# Patient Record
Sex: Female | Born: 1995
Health system: Southern US, Community
[De-identification: ages and names within clinical notes are randomized; demographics above are authoritative.]

## PROBLEM LIST (undated history)

## (undated) DIAGNOSIS — R011 Cardiac murmur, unspecified: Secondary | ICD-10-CM

## (undated) DIAGNOSIS — D649 Anemia, unspecified: Secondary | ICD-10-CM

## (undated) HISTORY — PX: OTHER SURGICAL HISTORY: SHX169

## (undated) HISTORY — PX: ADENOIDECTOMY: SUR15

## (undated) HISTORY — DX: Anemia, unspecified: D64.9

---
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
## 2016-01-15 DIAGNOSIS — Z00129 Encounter for routine child health examination without abnormal findings: Secondary | ICD-10-CM | POA: Diagnosis not present

## 2016-03-26 ENCOUNTER — Encounter: Payer: Self-pay | Admitting: Family Medicine

## 2016-07-16 ENCOUNTER — Encounter: Payer: Self-pay | Admitting: Family Medicine

## 2016-08-13 ENCOUNTER — Encounter: Payer: Self-pay | Admitting: Family Medicine

## 2016-10-14 ENCOUNTER — Encounter: Payer: Self-pay | Admitting: Family Medicine

## 2016-11-01 DIAGNOSIS — M542 Cervicalgia: Secondary | ICD-10-CM | POA: Diagnosis not present

## 2016-11-25 ENCOUNTER — Encounter: Payer: Self-pay | Admitting: Family Medicine

## 2016-12-17 ENCOUNTER — Encounter: Payer: Self-pay | Admitting: Family Medicine

## 2016-12-17 ENCOUNTER — Ambulatory Visit (INDEPENDENT_AMBULATORY_CARE_PROVIDER_SITE_OTHER): Payer: BLUE CROSS/BLUE SHIELD | Admitting: Family Medicine

## 2016-12-17 VITALS — BP 132/68 | HR 102 | Temp 98.3°F | Resp 14 | Ht 65.0 in | Wt 127.0 lb

## 2016-12-17 DIAGNOSIS — K30 Functional dyspepsia: Secondary | ICD-10-CM

## 2016-12-17 DIAGNOSIS — Z23 Encounter for immunization: Secondary | ICD-10-CM

## 2016-12-17 DIAGNOSIS — Z0000 Encounter for general adult medical examination without abnormal findings: Secondary | ICD-10-CM

## 2016-12-17 DIAGNOSIS — D508 Other iron deficiency anemias: Secondary | ICD-10-CM | POA: Diagnosis not present

## 2016-12-17 DIAGNOSIS — F411 Generalized anxiety disorder: Secondary | ICD-10-CM

## 2016-12-17 NOTE — Addendum Note (Signed)
Addended by: Phillips OdorSIX, CHRISTINA H on: 12/17/2016 05:29 PM   Modules accepted: Orders

## 2016-12-17 NOTE — Progress Notes (Signed)
Subjective:    Patient ID: Gwendolyn RileyAutumn S Vandagriff, female    DOB: 04/29/1996, 21 y.o.   MRN: [id number]  Patient presents for CPE (has college form) Pt here for physical exam. She is currently college student at [employer] and in [employer]  Sexual activity Alcohol/illicit/tobacco- denies Required Immunizations UTD for school, has not had HPV vaccine mother declines this Menses- Regular she is currently on her menstrual cycle. She has history of anxiety and mood disorder. This is been going on since childhood. We'll address his multiple times. She saw a therapist in the past. She states her anxiety was fairly well controlled while she was at school but the mother states that she went to about the depression and did not leave her room except for class. More recently her uncle passed away suddenly and since then she's had multiple somatic complaints and she is worried that something happened to her. Her sleep has been interrupted as well. They do not want to try any medications.  She also complains of reflux symptoms stools acid going up into her stomach and chest at times typically after she used heavy meals or spicy food and she often eats late because of her job. She denies any nausea vomiting no diarrhea. She is not taking anything for these symptoms.  Review Of Systems:  GEN- denies fatigue, fever, weight loss,weakness, recent illness HEENT- denies eye drainage, change in vision, nasal discharge, CVS- denies chest pain, palpitations RESP- denies SOB, cough, wheeze ABD- denies N/V, change in stools, abd pain GU- denies dysuria, hematuria, dribbling, incontinence MSK- denies joint pain, muscle aches, injury Neuro- denies headache, dizziness, syncope, seizure activity       Objective:    BP 132/68   Pulse (!) 102   Temp 98.3 F (36.8 C) (Oral)   Resp 14   Ht 5\' 5"  (1.651 m)   Wt 127 lb (57.6 kg)   LMP 12/15/2016   SpO2 99%   BMI 21.13 kg/m  GEN- NAD, alert and oriented  x3 HEENT- PERRL, EOMI, non injected sclera, pink conjunctiva, MMM, oropharynx clear Neck- Supple, no LAD, no thyromegaly  CVS- RRR, soft systolic murmur RESP-CTAB ABD-NABS,soft,NT,ND Psych- anxious, tearful at times, no SI, no HI EXT- No edema Pulses- Radial 2+        Assessment & Plan:      Problem List Items Addressed This Visit    Anemia   Relevant Orders   CBC with Differential/Platelet   Iron   GAD (generalized anxiety disorder)    We'll try melatonin for sleep. Think she would benefit from  going psychotherapy close to campus which is where she is residing even most of the summer.In case she goes through some difficulties at school,  she can have someone to talk to. I think that she would benefit from medication down the road but they decline at this time.      Relevant Orders   Ambulatory referral to Psychology    Other Visit Diagnoses    Routine general medical examination at a health care facility    -  Primary   CPE done, [facility] today, has history of anemia will recheck levels. Declines HPV, form completed for marching band   Relevant Orders   CBC with Differential/Platelet   Comprehensive metabolic panel   TSH   Acid indigestion       Trial of OTC zantac, discussed late meals and the fast food      Note: This dictation was prepared  with Dragon dictation along with smaller phrase technology. Any transcriptional errors that result from this process are unintentional.

## 2016-12-17 NOTE — Assessment & Plan Note (Addendum)
We'll try melatonin for sleep. Think she would benefit from  going psychotherapy close to campus which is where she is residing even most of the summer.In case she goes through some difficulties at school,  she can have someone to talk to. I think that she would benefit from medication down the road but they decline at this time.

## 2016-12-17 NOTE — Patient Instructions (Addendum)
F/u 1 YEAR For Physical  Try Zantac 75mg  at bedtime this is over the counter Try melatonin for sleep, over the counter  Referral to therapy

## 2016-12-18 LAB — COMPREHENSIVE METABOLIC PANEL
ALBUMIN: 4.6 g/dL (ref 3.6–5.1)
ALT: 5 U/L (ref 5–32)
AST: 13 U/L (ref 12–32)
Alkaline Phosphatase: 44 U/L — ABNORMAL LOW (ref 47–176)
BUN: 10 mg/dL (ref 7–20)
CHLORIDE: 106 mmol/L (ref 98–110)
CO2: 23 mmol/L (ref 20–31)
Calcium: 9.3 mg/dL (ref 8.9–10.4)
Creat: 0.89 mg/dL (ref 0.50–1.00)
Glucose, Bld: 95 mg/dL (ref 70–99)
POTASSIUM: 4.2 mmol/L (ref 3.8–5.1)
Sodium: 138 mmol/L (ref 135–146)
TOTAL PROTEIN: 7.4 g/dL (ref 6.3–8.2)
Total Bilirubin: 0.6 mg/dL (ref 0.2–1.1)

## 2016-12-18 LAB — CBC WITH DIFFERENTIAL/PLATELET
BASOS ABS: 0 {cells}/uL (ref 0–200)
Basophils Relative: 0 %
EOS ABS: 32 {cells}/uL (ref 15–500)
EOS PCT: 1 %
HCT: 32.2 % — ABNORMAL LOW (ref 35.0–45.0)
Hemoglobin: 10.9 g/dL — ABNORMAL LOW (ref 12.0–15.0)
Lymphocytes Relative: 34 %
Lymphs Abs: 1088 cells/uL (ref 850–3900)
MCH: 31.1 pg (ref 27.0–33.0)
MCHC: 33.9 g/dL (ref 32.0–36.0)
MCV: 92 fL (ref 80.0–100.0)
MONOS PCT: 11 %
MPV: 9.3 fL (ref 7.5–12.5)
Monocytes Absolute: 352 cells/uL (ref 200–950)
NEUTROS ABS: 1728 {cells}/uL (ref 1500–7800)
NEUTROS PCT: 54 %
PLATELETS: 271 10*3/uL (ref 140–400)
RBC: 3.5 MIL/uL — ABNORMAL LOW (ref 3.80–5.10)
RDW: 13.8 % (ref 11.0–15.0)
WBC: 3.2 10*3/uL — ABNORMAL LOW (ref 3.8–10.8)

## 2016-12-18 LAB — TSH: TSH: 0.84 m[IU]/L (ref 0.50–4.30)

## 2016-12-18 LAB — IRON: IRON: 132 ug/dL (ref 27–164)

## 2017-06-08 ENCOUNTER — Ambulatory Visit: Payer: Self-pay | Admitting: Family

## 2017-12-27 ENCOUNTER — Encounter: Payer: Self-pay | Admitting: Family Medicine

## 2017-12-27 ENCOUNTER — Ambulatory Visit (INDEPENDENT_AMBULATORY_CARE_PROVIDER_SITE_OTHER): Payer: BLUE CROSS/BLUE SHIELD | Admitting: Family Medicine

## 2017-12-27 ENCOUNTER — Other Ambulatory Visit: Payer: Self-pay

## 2017-12-27 VITALS — BP 130/82 | HR 65 | Temp 98.5°F | Resp 16 | Ht 65.0 in | Wt 122.1 lb

## 2017-12-27 DIAGNOSIS — Z23 Encounter for immunization: Secondary | ICD-10-CM

## 2017-12-27 DIAGNOSIS — Z111 Encounter for screening for respiratory tuberculosis: Secondary | ICD-10-CM | POA: Diagnosis not present

## 2017-12-27 DIAGNOSIS — F410 Panic disorder [episodic paroxysmal anxiety] without agoraphobia: Secondary | ICD-10-CM | POA: Diagnosis not present

## 2017-12-27 DIAGNOSIS — D508 Other iron deficiency anemias: Secondary | ICD-10-CM

## 2017-12-27 DIAGNOSIS — Z114 Encounter for screening for human immunodeficiency virus [HIV]: Secondary | ICD-10-CM

## 2017-12-27 DIAGNOSIS — F411 Generalized anxiety disorder: Secondary | ICD-10-CM

## 2017-12-27 DIAGNOSIS — N920 Excessive and frequent menstruation with regular cycle: Secondary | ICD-10-CM

## 2017-12-27 DIAGNOSIS — Z0000 Encounter for general adult medical examination without abnormal findings: Secondary | ICD-10-CM | POA: Diagnosis not present

## 2017-12-27 NOTE — Progress Notes (Signed)
Patient: Gwendolyn Anderson, Female    DOB: [birth year], 22 y.o.   MRN: [id number] Visit Date: 12/27/2017  Today's Provider: Delsa Grana, PA-C   Chief Complaint  Patient presents with  . Annual Exam   Subjective:    Annual physical exam Gwendolyn Anderson is a 22 y.o. female who presents today for health maintenance and complete physical. She feels fairly well. She reports exercising a lot during band season (August - Nov). She reports she is sleeping well.  Sleeping well except for maybe one day a week.  -----------------------------------------------------------------  She also presents for college paperwork requirements, brings in two different colleges requiring exam and immunizations.  She has been attending [employer] and does band there, but was offered a scholarship at another school, is considering attending, but she does have to decide by next week.   Hx of Anxiety, she reports its "still bad but not as bad as it has been in the past."  She still has not talked to a counselor at home or through college health resources.  She has been referred in the past by Dr. Buelah Manis with her PCP to obtain counseling.  She has noticed that she will get upset about something and her heart will race and it leads to anxiety attacks.  Only about once per week does she have difficulty sleeping because of feeling anxious or continually thinking about things.  She notices her symptoms are worse during her periods.  She notes that her menses used to be very light and started to become very heavy after she stopped playing basketball in high school that was after graduating early in 22 years old so for the past 3 to 4 years she states that she has had much heavier periods.  She notices having palpitations, racing heart, occasional lightheaded episodes during her menses.  Does tend to trigger her anxiety or anxiety attacks much more during this week every month.  LMP 12/25/17, cycles are typically monthly and  menses lasts 5 days, she uses a large "10 hour pad" because she is constantly leaking blood she states that on her heavier days during her.  She changes extra-large pad at least every 4 hours.  She was noted to have anemia and iron deficiency at her last CPE, has been taking iron supplements, would like to have it rechecked.  She has never been sexually active with vaginal penetration in the past, but has previously engaged in oral sex.  Not currently sexually active.   Results for Gwendolyn Anderson (MRN [id number]) as of 12/27/2017 18:14  Ref. Range 02/09/2013 08:53 08/13/2013 10:18 12/17/2016 15:56  WBC Latest Ref Range: 3.8 - 10.8 K/uL 4.1 (L) 3.5 (L) 3.2 (L)  RBC Latest Ref Range: 3.80 - 5.10 MIL/uL 3.96 3.75 (L) 3.50 (L)  Hemoglobin Latest Ref Range: 12.0 - 15.0 g/dL 12.0 11.5 (L) 10.9 (L)  HCT Latest Ref Range: 35.0 - 45.0 % 34.9 (L) 33.4 (L) 32.2 (L)  MCV Latest Ref Range: 80.0 - 100.0 fL 88.1 89.1 92.0  MCH Latest Ref Range: 27.0 - 33.0 pg 30.3 30.7 31.1  MCHC Latest Ref Range: 32.0 - 36.0 g/dL 34.4 34.4 33.9  RDW Latest Ref Range: 11.0 - 15.0 % 13.3 13.9 13.8  Platelets Latest Ref Range: 140 - 400 K/uL 264 269 271   Results for Gwendolyn Anderson (MRN [id number]) as of 12/27/2017 18:14  Ref. Range 12/17/2016 15:56  Iron Latest Ref Range: 27 - 164 ug/dL 132  Review of Systems  Constitutional: Negative.  Negative for activity change and unexpected weight change.  HENT: Negative.   Eyes: Negative.   Respiratory: Negative.  Negative for chest tightness and shortness of breath.   Cardiovascular: Negative.  Negative for chest pain.  Gastrointestinal: Negative.  Negative for abdominal pain, constipation, diarrhea and vomiting.  Endocrine: Negative.   Genitourinary: Positive for menstrual problem. Negative for difficulty urinating, dysuria, flank pain, frequency, genital sores, hematuria, pelvic pain, vaginal discharge and vaginal pain.  Musculoskeletal: Negative.   Skin: Negative.     Allergic/Immunologic: Negative.   Neurological: Negative.  Negative for dizziness, syncope and weakness.  Hematological: Negative.   Psychiatric/Behavioral: Negative.  Negative for self-injury and suicidal ideas.  All other systems reviewed and are negative.   Social History      She  reports that she has never smoked. She has never used smokeless tobacco. She reports that she does not drink alcohol or use drugs.       Social History   Socioeconomic History  . Marital status: Single    Spouse name: Not on file  . Number of children: Not on file  . Years of education: Not on file  . Highest education level: Not on file  Occupational History  . Not on file  Social Needs  . Financial resource strain: Not on file  . Food insecurity:    Worry: Not on file    Inability: Not on file  . Transportation needs:    Medical: Not on file    Non-medical: Not on file  Tobacco Use  . Smoking status: Never Smoker  . Smokeless tobacco: Never Used  Substance and Sexual Activity  . Alcohol use: No  . Drug use: No  . Sexual activity: Never  Lifestyle  . Physical activity:    Days per week: 0 days    Minutes per session: Not on file  . Stress: Not on file  Relationships  . Social connections:    Talks on phone: Not on file    Gets together: Not on file    Attends religious service: Not on file    Active member of club or organization: Not on file    Attends meetings of clubs or organizations: Not on file    Relationship status: Not on file  Other Topics Concern  . Not on file  Social History Narrative  . Not on file    Past Medical History:  Diagnosis Date  . Anemia   . Heart murmur      Patient Active Problem List   Diagnosis Date Noted  . Allergic urticaria 11/23/2013  . Unequal leg length 09/13/2013  . Chondromalacia 09/13/2013  . Heart murmur 04/23/2013  . Panic attacks 02/12/2013  . GAD (generalized anxiety disorder) 02/12/2013  . Anemia 11/24/2010    Past  Surgical History:  Procedure Laterality Date  . ADENOIDECTOMY    . adnoids      Family History        Family Status  Relation Name Status  . Mother  Alive  . Father  Alive  . Sister  Alive  . MGM  (Not Specified)        Her family history includes Anxiety disorder in her maternal grandmother and mother; Heart murmur in her mother.      No Known Allergies  No current outpatient medications on file.   Patient Care Team: [facility], Modena Nunnery, MD as PCP - General (Family Medicine)  Objective:   Vitals: BP 130/82   Pulse 65   Temp 98.5 F (36.9 C) (Oral)   Resp 16   Ht 5' 5"  (1.651 m)   Wt 122 lb 2 oz (55.4 kg)   SpO2 100%   BMI 20.32 kg/m     Vitals:   12/27/17 1415  BP: 130/82  Pulse: 65  Resp: 16  Temp: 98.5 F (36.9 C)  TempSrc: Oral  SpO2: 100%  Weight: 122 lb 2 oz (55.4 kg)  Height: 5' 5"  (1.651 m)     Physical Exam  Constitutional: She is oriented to person, place, and time. She appears well-developed and well-nourished.  Non-toxic appearance. No distress.  HENT:  Head: Normocephalic and atraumatic.  Right Ear: External ear normal.  Left Ear: External ear normal.  Nose: Nose normal.  Mouth/Throat: Uvula is midline, oropharynx is clear and moist and mucous membranes are normal. No oropharyngeal exudate.  Mild palpebral conjunctival pallor  Eyes: Pupils are equal, round, and reactive to light. Conjunctivae, EOM and lids are normal. No scleral icterus.  Neck: Normal range of motion and phonation normal. Neck supple. No tracheal deviation present. No thyromegaly present.  Cardiovascular: Normal rate, regular rhythm and normal pulses. Exam reveals no gallop and no friction rub.  Murmur heard. Pulses:      Radial pulses are 2+ on the right side, and 2+ on the left side.       Posterior tibial pulses are 2+ on the right side, and 2+ on the left side.  Pulmonary/Chest: Effort normal and breath sounds normal. No stridor. No respiratory distress. She has  no wheezes. She has no rhonchi. She has no rales. She exhibits no tenderness.  Abdominal: Soft. Normal appearance and bowel sounds are normal. She exhibits no distension and no mass. There is no tenderness. There is no rebound and no guarding.  Musculoskeletal: Normal range of motion. She exhibits no edema or deformity.  Lymphadenopathy:    She has no cervical adenopathy.  Neurological: She is alert and oriented to person, place, and time. She exhibits normal muscle tone. Gait normal.  Skin: Skin is warm, dry and intact. Capillary refill takes less than 2 seconds. No rash noted. She is not diaphoretic. No pallor.  Psychiatric: She has a normal mood and affect. Her speech is normal and behavior is normal.  Nursing note and vitals reviewed.    Depression Screen PHQ 2/9 Scores 12/27/2017 12/17/2016  PHQ - 2 Score 1 3  PHQ- 9 Score 3 6    Hearing Screening  Edited by: Launa Grill, LPN   125hz  250hz  500hz  1000hz  2000hz  3000hz  4000hz  6000hz  8000hz   Right ear   Pass Pass Pass  Pass    Left ear   Pass Pass Pass  Pass    Vision Screening  Edited by: Launa Grill, LPN   Right eye Left eye Both eyes  Without correction 20/20 20/20 20/20      Assessment & Plan:     Routine Health Maintenance and Physical Exam  Exercise Activities and Dietary recommendations Goals    . Increase physical activity     As tolerated     Start exercising in Band off season, 30 min 3 x a week, aerobic activity as tolerated.  She has healthy BMI and good VS, exercise will improve overall health and be a stress relief, may help anxiety and occasional sleep disturbances.   Immunization History  Administered Date(s) Administered  . DTaP 03/13/1997, 05/14/1997, 07/17/1997, 05/12/1998, 01/25/2002  .  Hepatitis B May 29, 1996, 03/13/1997, 07/17/1997  . HiB (PRP-OMP) 03/13/1997, 05/14/1997, 07/17/1997, 05/12/1998  . IPV 03/13/1997, 05/14/1997, 07/17/1997, 01/25/2002  . MMR 02/04/1998, 01/25/2002  .  Meningococcal B, OMV 12/06/2014, 12/17/2016  . Meningococcal Conjugate 12/06/2014  . PPD Test 12/06/2014  . Td 11/11/2007  . Tdap 11/11/2007, 12/27/2017  . Varicella 02/04/1998    Health Maintenance  Topic Date Due  . HIV Screening  01/20/2012  . INFLUENZA VACCINE  01/12/2018  . TETANUS/TDAP  12/28/2027    Discussed health benefits of physical activity, and encouraged her to engage in regular exercise appropriate for her age and condition.     Problem List Items Addressed This Visit      Other   Anemia    Repeat CBC Continue iron supplement as previously prescribed       Relevant Orders   CBC with Differential/Platelet (Completed)   Panic attacks   GAD (generalized anxiety disorder)    Patient seems to only have sleep on average 1 time a week, she is no longer using melatonin.  Symptoms do seem to be worse during her menses where she feels slightly weak, lightheaded has rapid heart rate which causes her to have worse anxiety.  We are working that up today.   Again encouraged her to seek psychiatry, psychology, talk therapy, cognitive behavioral therapy or counseling.  She declines any medical treatment at this time.      Menorrhagia with regular cycle    CBC with diff. Continue iron supplementation If Hb worse than last two labs, discussed possible OBGYN referral for eval and tx (OCP's, progesterones, [place] eval to r/o polyps, etc.)       Other Visit Diagnoses    Routine general medical examination at a health care facility    -  Primary   Relevant Orders   CBC with Differential/Platelet (Completed)   COMPLETE METABOLIC PANEL WITH GFR (Completed)   HIV antibody (Completed)   Screening for HIV (human immunodeficiency virus)       Relevant Orders   HIV antibody (Completed)   Screening for tuberculosis       Relevant Orders   QuantiFERON-TB Gold Plus   Need for vaccination       Relevant Orders   Tdap vaccine greater than or equal to 7yo IM (Completed)      GAD  (generalized anxiety disorder) Pt with hx of anxiety, does not seem improved much from prior years.  She has never completed any counseling or therapy.  She does not want any medications. Encouraged pt to get counseling appt for talk therapy or CBT when back at school to help her manage.  Also advised pt to call us to discuss medical tx if her sx become unmanageable or start to affect school/work performance or relationships etc. Discussed SSRI trial and referral to counseling to control her anxiety and she does not want to start a medication now, will try and go to therapy after going back to college.  Encouraged her to reconsider any medical treatment if symptoms become so severe that they are interfering with her success in daily activities such as her grades, extracurricular activities, relationships, physical health, routine ability to sleep, etc. verbalized understanding. Also discussed nonpharm methods to manage anxiety Pt was given outpatient psychiatry and psychology resource guide, also suicide hotline # and ER precautions. Currently denies SI, HI, AVH, no concern for danger to self or others  To college forms given for completion, currently going to [employer], additional form for [employer] in [place] where she obtain a scholarship. Completed both forms, vision/hearing screening updated, immunizations updated and pending quantiferon test.  Upon completion forms will have copy scanned into chart and pt will pick up original (forms with Ofilia Neas, LPN pending completion).  Delsa Grana, PA-C 12/27/17 6:16 PM  [facility]

## 2017-12-27 NOTE — Patient Instructions (Addendum)

## 2017-12-28 DIAGNOSIS — N920 Excessive and frequent menstruation with regular cycle: Secondary | ICD-10-CM | POA: Insufficient documentation

## 2017-12-28 LAB — CBC WITH DIFFERENTIAL/PLATELET
Basophils Absolute: 8 cells/uL (ref 0–200)
Basophils Relative: 0.2 %
Eosinophils Absolute: 28 cells/uL (ref 15–500)
Eosinophils Relative: 0.7 %
HEMATOCRIT: 32.2 % — AB (ref 35.0–45.0)
HEMOGLOBIN: 10.9 g/dL — AB (ref 11.7–15.5)
Lymphs Abs: 1124 cells/uL (ref 850–3900)
MCH: 31.7 pg (ref 27.0–33.0)
MCHC: 33.9 g/dL (ref 32.0–36.0)
MCV: 93.6 fL (ref 80.0–100.0)
MPV: 10.2 fL (ref 7.5–12.5)
Monocytes Relative: 8.7 %
NEUTROS PCT: 62.3 %
Neutro Abs: 2492 cells/uL (ref 1500–7800)
Platelets: 257 10*3/uL (ref 140–400)
RBC: 3.44 10*6/uL — ABNORMAL LOW (ref 3.80–5.10)
RDW: 13.1 % (ref 11.0–15.0)
Total Lymphocyte: 28.1 %
WBC: 4 10*3/uL (ref 3.8–10.8)
WBCMIX: 348 {cells}/uL (ref 200–950)

## 2017-12-28 LAB — HIV ANTIBODY (ROUTINE TESTING W REFLEX): HIV 1&2 Ab, 4th Generation: NONREACTIVE

## 2017-12-28 LAB — COMPLETE METABOLIC PANEL WITH GFR
AG RATIO: 1.6 (calc) (ref 1.0–2.5)
ALBUMIN MSPROF: 4.5 g/dL (ref 3.6–5.1)
ALKALINE PHOSPHATASE (APISO): 52 U/L (ref 33–115)
ALT: 7 U/L (ref 6–29)
AST: 17 U/L (ref 10–30)
BILIRUBIN TOTAL: 0.4 mg/dL (ref 0.2–1.2)
BUN: 9 mg/dL (ref 7–25)
CO2: 22 mmol/L (ref 20–32)
CREATININE: 0.81 mg/dL (ref 0.50–1.10)
Calcium: 9.3 mg/dL (ref 8.6–10.2)
Chloride: 105 mmol/L (ref 98–110)
GFR, Est African American: 121 mL/min/{1.73_m2} (ref >=60)
GFR, Est Non African American: 105 mL/min/{1.73_m2} (ref >=60)
GLOBULIN: 2.8 g/dL (ref 1.9–3.7)
Glucose, Bld: 97 mg/dL (ref 65–99)
Potassium: 3.7 mmol/L (ref 3.5–5.3)
SODIUM: 138 mmol/L (ref 135–146)
Total Protein: 7.3 g/dL (ref 6.1–8.1)

## 2017-12-28 NOTE — Assessment & Plan Note (Signed)
Repeat CBC Continue iron supplement as previously prescribed

## 2017-12-28 NOTE — Assessment & Plan Note (Addendum)
CBC with diff. Continue iron supplementation If Hb worse than last two labs, discussed possible OBGYN referral for eval and tx (OCP's, progesterones, [place] eval to r/o polyps, etc.)

## 2017-12-28 NOTE — Assessment & Plan Note (Signed)
Patient seems to only have sleep on average 1 time a week, she is no longer using melatonin.  Symptoms do seem to be worse during her menses where she feels slightly weak, lightheaded has rapid heart rate which causes her to have worse anxiety.  We are working that up today.   Again encouraged her to seek psychiatry, psychology, talk therapy, cognitive behavioral therapy or counseling.  She declines any medical treatment at this time.

## 2017-12-29 LAB — QUANTIFERON-TB GOLD PLUS
NIL: 0.02 [IU]/mL
QuantiFERON-TB Gold Plus: NEGATIVE
TB1-NIL: 0.01 [IU]/mL
TB2-NIL: 0 IU/mL

## 2017-12-29 NOTE — Progress Notes (Signed)
Negative Quantiferon Gold negative - can add to college paperwork (write in and initial or print off and add to the back of her immunizations) please notify pt everything is resulted and notify of completed college forms, thank you

## 2018-02-09 DIAGNOSIS — S99921A Unspecified injury of right foot, initial encounter: Secondary | ICD-10-CM | POA: Diagnosis not present

## 2018-02-09 DIAGNOSIS — M79671 Pain in right foot: Secondary | ICD-10-CM | POA: Diagnosis not present

## 2018-02-22 ENCOUNTER — Telehealth: Payer: Self-pay | Admitting: *Deleted

## 2018-02-22 NOTE — Telephone Encounter (Signed)
Received call from patient mother, Misty Stanley.   Reports that patient was playing basketball this weekend and noted palpitations and tachycardia that resolved after rest. States that she discussed palpitations and tachycardia during CPE.   Requested referral be placed to [facility] as patient has been seen there in the past for palpitations.   Please advise.

## 2018-02-22 NOTE — Telephone Encounter (Signed)
Patient is currently attending college in [place]. Will call back to schedule appointment.

## 2018-02-22 NOTE — Telephone Encounter (Signed)
She would need to come in for an appt addressing this specifically.  She has two other causes of palpitations which are not cardiac so they need to be evaluated and cardiology will not do that, and it would be inappropriate for me refer a problem that has not been worked up yet.   We can start cardiac work up while addressing her other problems that can cause palpitations.   Please schedule her

## 2018-05-10 ENCOUNTER — Other Ambulatory Visit: Payer: Self-pay

## 2018-05-10 ENCOUNTER — Encounter (HOSPITAL_COMMUNITY): Payer: Self-pay | Admitting: Emergency Medicine

## 2018-05-10 ENCOUNTER — Emergency Department (HOSPITAL_COMMUNITY)
Admission: EM | Admit: 2018-05-10 | Discharge: 2018-05-10 | Disposition: A | Discharge status: Home / Self Care | Payer: BLUE CROSS/BLUE SHIELD | Attending: Emergency Medicine | Admitting: Emergency Medicine

## 2018-05-10 DIAGNOSIS — B2790 Infectious mononucleosis, unspecified without complication: Secondary | ICD-10-CM | POA: Diagnosis not present

## 2018-05-10 DIAGNOSIS — J029 Acute pharyngitis, unspecified: Secondary | ICD-10-CM | POA: Diagnosis not present

## 2018-05-10 DIAGNOSIS — R070 Pain in throat: Secondary | ICD-10-CM | POA: Diagnosis not present

## 2018-05-10 LAB — MONONUCLEOSIS SCREEN: Mono Screen: POSITIVE — AB

## 2018-05-10 LAB — GROUP A STREP BY PCR: Group A Strep by PCR: NOT DETECTED

## 2018-05-10 MED ORDER — PREDNISONE 10 MG PO TABS: ORAL_TABLET | ORAL | 0 refills | Status: DC

## 2018-05-10 MED ORDER — IBUPROFEN 400 MG PO TABS
400.0000 mg | ORAL_TABLET | Freq: Once | ORAL | Status: AC
  Administered 2018-05-10: 400 mg via ORAL

## 2018-05-10 MED ORDER — IBUPROFEN 400 MG PO TABS
ORAL_TABLET | ORAL | Status: AC
  Administered 2018-05-10: 400 mg via ORAL
  Filled 2018-05-10: qty 1

## 2018-05-10 NOTE — Discharge Instructions (Signed)
Rest and make sure you are drinking plenty of fluids.  Prednisone is recommended to help reduce your swollen tonsils and has been prescribed for you.  You indicated that you do not like this medicine so you can try ibuprofen (motrin or advil) first, but if your tonsils continue to remain so enlarged, I would recommend taking the prednisone taper.  See your doctor for any new or worsened symptoms.

## 2018-05-10 NOTE — ED Triage Notes (Signed)
Pt c/o sinus pressure and drainage x one week. Per mother pt's throat is swollen.

## 2018-05-11 NOTE — ED Provider Notes (Signed)
[facility] EMERGENCY DEPARTMENT Provider Note   CSN: [id number] Arrival date & time: 05/10/18  2131     History   Chief Complaint Chief Complaint  Patient presents with  . Sore Throat    HPI Gwendolyn Anderson is a 22 y.o. female who is home from college for the holiday weekend presenting with a 1 week history of sinus pressure, nasal drainage and enlarged tonsils.  She denies throat or tonsillar pain and also denies any fevers or chills and has no difficulty eating or swallowing.  Nasal discharge has been clear.  She has had no cough, sob, also denies headache, ear pain, neck pain or stiffness. Her mother looked at her tonsils this evening and noted swelling with white spots, prompting this visit. She has had no treatment prior to arrival.   She is in marching band at her school and states she has been exposed to other members with similar symptoms.  The history is provided by the patient and a parent.    Past Medical History:  Diagnosis Date  . Anemia   . Heart murmur     Patient Active Problem List   Diagnosis Date Noted  . Menorrhagia with regular cycle 12/28/2017  . Allergic urticaria 11/23/2013  . Unequal leg length 09/13/2013  . Chondromalacia 09/13/2013  . Heart murmur 04/23/2013  . Panic attacks 02/12/2013  . GAD (generalized anxiety disorder) 02/12/2013  . Anemia 11/24/2010    Past Surgical History:  Procedure Laterality Date  . ADENOIDECTOMY    . adnoids       OB History   None      Home Medications    Prior to Admission medications   Medication Sig Start Date End Date Taking? Authorizing Provider  predniSONE (DELTASONE) 10 MG tablet Take 6 tablets day one, 5 tablets day two, 4 tablets day three, 3 tablets day four, 2 tablets day five, then 1 tablet day six 05/10/18   Burgess Amor, PA-C    Family History Family History  Problem Relation Age of Onset  . Heart murmur Mother   . Anxiety disorder Mother   . Anxiety disorder Maternal Grandmother      Social History Social History   Tobacco Use  . Smoking status: Never Smoker  . Smokeless tobacco: Never Used  Substance Use Topics  . Alcohol use: No  . Drug use: No     Allergies   Patient has no known allergies.   Review of Systems Review of Systems  Constitutional: Negative for chills and fever.  HENT: Positive for congestion, rhinorrhea and sinus pressure. Negative for ear pain, mouth sores, sore throat, trouble swallowing and voice change.   Eyes: Negative for discharge.  Respiratory: Negative for cough, shortness of breath, wheezing and stridor.   Cardiovascular: Negative for chest pain.  Gastrointestinal: Negative for abdominal pain.  Genitourinary: Negative.      Physical Exam Updated Vital Signs BP 135/88 (BP Location: Right Arm)   Pulse (!) 104   Temp 98 F (36.7 C) (Oral)   Resp 16   Ht 5\' 5"  (1.651 m)   Wt 54.9 kg   LMP 05/10/2018   SpO2 100%   BMI 20.14 kg/m   Physical Exam  Constitutional: She is oriented to person, place, and time. She appears well-developed and well-nourished.  HENT:  Head: Normocephalic and atraumatic.  Right Ear: Tympanic membrane and ear canal normal.  Left Ear: Tympanic membrane and ear canal normal.  Nose: Mucosal edema present. No rhinorrhea.  Mouth/Throat:  Uvula is midline and mucous membranes are normal. Posterior oropharyngeal erythema present. No oropharyngeal exudate, posterior oropharyngeal edema or tonsillar abscesses. Tonsils are 3+ on the right. Tonsils are 3+ on the left. Tonsillar exudate.  Eyes: Conjunctivae are normal.  Cardiovascular: Normal rate and normal heart sounds.  Pulmonary/Chest: Effort normal. No respiratory distress. She has no wheezes. She has no rales.  Abdominal: Soft. There is no tenderness. There is no guarding.  Musculoskeletal: Normal range of motion.  Lymphadenopathy:       Head (right side): Tonsillar adenopathy present. No posterior auricular and no occipital adenopathy present.        Head (left side): Tonsillar adenopathy present. No posterior auricular and no occipital adenopathy present.    She has no cervical adenopathy.  Neurological: She is alert and oriented to person, place, and time.  Skin: Skin is warm and dry. No rash noted.  Psychiatric: She has a normal mood and affect.     ED Treatments / Results  Labs (all labs ordered are listed, but only abnormal results are displayed) Labs Reviewed  MONONUCLEOSIS SCREEN - Abnormal; Notable for the following components:      Result Value   Mono Screen POSITIVE (*)    All other components within normal limits  GROUP A STREP BY PCR    EKG None  Radiology No results found.  Procedures Procedures (including critical care time)  Medications Ordered in ED Medications  ibuprofen (ADVIL,MOTRIN) tablet 400 mg (400 mg Oral Given 05/10/18 2352)     Initial Impression / Assessment and Plan / ED Course  I have reviewed the triage vital signs and the nursing notes.  Pertinent labs & imaging results that were available during my care of the patient were reviewed by me and considered in my medical decision making (see chart for details).     Lab tests reviewed and discussed with patient and mother.  Discussed expected course of this viral infection.  Advised rest, Tylenol or Motrin if she develops any fevers or pain complaints.  Recommended Decadron injection given significant tonsillar hypertrophy.  Patient and her mother declines this medication.  I did prescribe a course of prednisone in tapering fashion, with strong recommendation.  However patient may take ibuprofen first to see if this will improve the tonsillar's hypertrophy.  She knows to start the prednisone if the tonsils do not respond to ibuprofen.  Also discussed warm salt gargles.  Discussed this is a contagious infection and to use precautions at home to avoid spreading this infection.  Advised follow-up with her PCP for recheck for any new concerns or  persistent or worsening symptoms.  Final Clinical Impressions(s) / ED Diagnoses   Final diagnoses:  Infectious mononucleosis without complication, infectious mononucleosis due to unspecified organism    ED Discharge Orders         Ordered    predniSONE (DELTASONE) 10 MG tablet     05/10/18 2336           Burgess Amordol, Javonn Gauger, PA-C 05/11/18 0033    Sabas SousBero, Michael M, MD 05/12/18 1500

## 2018-11-08 ENCOUNTER — Ambulatory Visit: Payer: BLUE CROSS/BLUE SHIELD | Admitting: Family Medicine

## 2018-11-08 ENCOUNTER — Other Ambulatory Visit: Payer: Self-pay

## 2018-11-08 VITALS — BP 118/76 | HR 122 | Temp 99.0°F | Resp 18 | Ht 65.0 in | Wt 123.0 lb

## 2018-11-08 DIAGNOSIS — R002 Palpitations: Secondary | ICD-10-CM | POA: Diagnosis not present

## 2018-11-08 DIAGNOSIS — F411 Generalized anxiety disorder: Secondary | ICD-10-CM

## 2018-11-08 MED ORDER — PROPRANOLOL HCL 10 MG PO TABS: 10.0000 mg | ORAL_TABLET | Freq: Two times a day (BID) | ORAL | 2 refills | Status: DC | PRN

## 2018-11-08 NOTE — Progress Notes (Signed)
   Subjective:    Patient ID: Gwendolyn Anderson, female    DOB: August 24, 1995, 23 y.o.   MRN: [id number]  Patient presents for Palpitations (off and on for x2 yrs, more often before menstration)   Currently in 3rd year at  [employer].    She has had palpitations on and off for past 2 years Has history of GAD. Seen by cardiology in the past with unremarkable holter monitor and ECHO.  Feels like she has symptoms a week before her period the last 2 months only. Episodes go for a few seconds then stop. Denies any pain or cramping, no dizziness, no SOB Another episode she describes, she was playing basketball and started having episodes that lasted a few seconds, she stopped had water it resolved and went back to playing- this was at her college campus She has been able to exercise since then with no symptoms   Lives with grand-parents which can be stressful, due to COVID can not see her friends  She is worried about bringing things home to her grandparents    She also states she was having a lot of episodes before she transferred to her current college    No caffeine, eats a lot of sugar  Wants to be rechecked for Mono   Review Of Systems:  GEN- denies fatigue, fever, weight loss,weakness, recent illness HEENT- denies eye drainage, change in vision, nasal discharge, CVS- denies chest pain,+ palpitations RESP- denies SOB, cough, wheeze ABD- denies N/V, change in stools, abd pain GU- denies dysuria, hematuria, dribbling, incontinence MSK- denies joint pain, muscle aches, injury Neuro- denies headache, dizziness, syncope, seizure activity       Objective:    BP 118/76   Pulse (!) 122   Temp 99 F (37.2 C)   Resp 18   Ht 5\' 5"  (1.651 m)   Wt 123 lb (55.8 kg)   SpO2 98%   BMI 20.47 kg/m  GEN- NAD, alert and oriented x3 HEENT- PERRL, EOMI, non injected sclera, pink conjunctiva, MMM, oropharynx clear Neck- Supple, no thyromegaly CVS- tachycardic, normal rhythem HR  100, no  murmur RESP-CTAB Psych- anxious appearing, not depressed, well groomed , normal speech EXT- No edema Pulses- Radial 2+        Assessment & Plan:      Problem List Items Addressed This Visit      Unprioritized   GAD (generalized anxiety disorder) - Primary    Long standing history of anxiety, Which I dont feel she has really grasped that this is the cause. She has had cardiac workup in the past for murmur palpitations which was normal.  She does not want to be on SSRI States she just gets episodes Will give her propranolol to take as needed if she is feeling very anxious or has a spell Recheck in 4 weeks, to see how much she used and for CPE She also asked her Mono titers be rechecked she still feels her tonsils are large        Other Visit Diagnoses    Palpitations          Note: This dictation was prepared with Dragon dictation along with smaller phrase technology. Any transcriptional errors that result from this process are unintentional.

## 2018-11-08 NOTE — Patient Instructions (Addendum)
F/U Physical in 4 weeks - morning appointment

## 2018-11-09 ENCOUNTER — Encounter: Payer: Self-pay | Admitting: Family Medicine

## 2018-11-09 NOTE — Assessment & Plan Note (Signed)
Long standing history of anxiety, Which I dont feel she has really grasped that this is the cause. She has had cardiac workup in the past for murmur palpitations which was normal.  She does not want to be on SSRI States she just gets episodes Will give her propranolol to take as needed if she is feeling very anxious or has a spell Recheck in 4 weeks, to see how much she used and for CPE She also asked her Mono titers be rechecked she still feels her tonsils are large

## 2018-12-06 ENCOUNTER — Other Ambulatory Visit: Payer: BC Managed Care – PPO

## 2018-12-06 ENCOUNTER — Other Ambulatory Visit: Payer: Self-pay

## 2018-12-06 DIAGNOSIS — Z0000 Encounter for general adult medical examination without abnormal findings: Secondary | ICD-10-CM

## 2018-12-07 LAB — CBC WITH DIFFERENTIAL/PLATELET
Absolute Monocytes: 258 cells/uL (ref 200–950)
Basophils Absolute: 10 cells/uL (ref 0–200)
Basophils Relative: 0.3 %
Eosinophils Absolute: 31 cells/uL (ref 15–500)
Eosinophils Relative: 0.9 %
HCT: 33.9 % — ABNORMAL LOW (ref 35.0–45.0)
Hemoglobin: 11.4 g/dL — ABNORMAL LOW (ref 11.7–15.5)
Lymphs Abs: 1180 cells/uL (ref 850–3900)
MCH: 31 pg (ref 27.0–33.0)
MCHC: 33.6 g/dL (ref 32.0–36.0)
MCV: 92.1 fL (ref 80.0–100.0)
MPV: 10.6 fL (ref 7.5–12.5)
Monocytes Relative: 7.6 %
Neutro Abs: 1921 cells/uL (ref 1500–7800)
Neutrophils Relative %: 56.5 %
Platelets: 248 10*3/uL (ref 140–400)
RBC: 3.68 10*6/uL — ABNORMAL LOW (ref 3.80–5.10)
RDW: 13.4 % (ref 11.0–15.0)
Total Lymphocyte: 34.7 %
WBC: 3.4 10*3/uL — ABNORMAL LOW (ref 3.8–10.8)

## 2018-12-07 LAB — COMPREHENSIVE METABOLIC PANEL
AG Ratio: 1.7 (calc) (ref 1.0–2.5)
ALT: 5 U/L — ABNORMAL LOW (ref 6–29)
AST: 13 U/L (ref 10–30)
Albumin: 4.7 g/dL (ref 3.6–5.1)
Alkaline phosphatase (APISO): 43 U/L (ref 31–125)
BUN: 9 mg/dL (ref 7–25)
CO2: 23 mmol/L (ref 20–32)
Calcium: 9.6 mg/dL (ref 8.6–10.2)
Chloride: 105 mmol/L (ref 98–110)
Creat: 0.85 mg/dL (ref 0.50–1.10)
Globulin: 2.8 g/dL (calc) (ref 1.9–3.7)
Glucose, Bld: 84 mg/dL (ref 65–99)
Potassium: 4.2 mmol/L (ref 3.5–5.3)
Sodium: 138 mmol/L (ref 135–146)
Total Bilirubin: 0.7 mg/dL (ref 0.2–1.2)
Total Protein: 7.5 g/dL (ref 6.1–8.1)

## 2018-12-07 LAB — LIPID PANEL
Cholesterol: 196 mg/dL (ref <200)
HDL: 77 mg/dL (ref >=50)
LDL Cholesterol (Calc): 108 mg/dL (calc) — ABNORMAL HIGH
Non-HDL Cholesterol (Calc): 119 mg/dL (calc) (ref <130)
Total CHOL/HDL Ratio: 2.5 (calc) (ref <5.0)
Triglycerides: 38 mg/dL (ref <150)

## 2018-12-11 ENCOUNTER — Encounter: Payer: BLUE CROSS/BLUE SHIELD | Admitting: Family Medicine

## 2019-04-27 ENCOUNTER — Encounter: Payer: BC Managed Care – PPO | Admitting: Family Medicine

## 2019-06-19 ENCOUNTER — Other Ambulatory Visit: Payer: Self-pay

## 2019-06-19 ENCOUNTER — Ambulatory Visit (INDEPENDENT_AMBULATORY_CARE_PROVIDER_SITE_OTHER): Payer: BC Managed Care – PPO | Admitting: Family Medicine

## 2019-06-19 ENCOUNTER — Encounter: Payer: Self-pay | Admitting: Family Medicine

## 2019-06-19 VITALS — BP 138/90 | HR 120 | Temp 98.7°F | Resp 16 | Ht 65.0 in | Wt 131.0 lb

## 2019-06-19 DIAGNOSIS — D508 Other iron deficiency anemias: Secondary | ICD-10-CM

## 2019-06-19 DIAGNOSIS — R253 Fasciculation: Secondary | ICD-10-CM

## 2019-06-19 DIAGNOSIS — Z0001 Encounter for general adult medical examination with abnormal findings: Secondary | ICD-10-CM

## 2019-06-19 DIAGNOSIS — F411 Generalized anxiety disorder: Secondary | ICD-10-CM

## 2019-06-19 DIAGNOSIS — Z0000 Encounter for general adult medical examination without abnormal findings: Secondary | ICD-10-CM

## 2019-06-19 DIAGNOSIS — R000 Tachycardia, unspecified: Secondary | ICD-10-CM

## 2019-06-19 MED ORDER — PROPRANOLOL HCL 10 MG PO TABS: 10.0000 mg | ORAL_TABLET | Freq: Two times a day (BID) | ORAL | 2 refills | Status: AC | PRN

## 2019-06-19 NOTE — Patient Instructions (Addendum)
F/u 1 year for Physical Schedule PAP Smear at spring break I recommend therapy for your anxiety We will call with lab results

## 2019-06-19 NOTE — Assessment & Plan Note (Signed)
Recheck her iron levels.  She is going to return for a Pap smear on spring break.

## 2019-06-19 NOTE — Progress Notes (Signed)
Subjective:    Patient ID: Gwendolyn Anderson, female    DOB: 20-Aug-1995, 24 y.o.   MRN: [id number]  Patient presents for Annual Exam (is not fasting- defers PAP) Patient here for complete physical exam She is overdue for Pap smear but is currently on her menstrual cycle.  Her menses is regular.  She is not sexually active. History of tachycardia with palpitations high anxiety.  She has had cardiac work-up which was benign.  She was given propanolol at her last visit but she has not used this.  States that she does not want to be on any medications.  States that she is considering going to a therapist in [place] that her sister went to for her anxiety.  States her heart rate has been under control until she goes into a doctor's office and it skyrocket.  Her only major concern is that she has had some muscle twitching in her legs typically when she goes to lay down at nighttime or she is just lounging around.  She does not have any pain but just notices some twitching.  She denies any back pain associated.  No tingling or numbness.  She also has underlying iron deficiency anemia she has been taking vitamins regularly would like to have this rechecked.  She is planning to return to her college at [employer] next week.    Review Of Systems:  GEN- denies fatigue, fever, weight loss,weakness, recent illness HEENT- denies eye drainage, change in vision, nasal discharge, CVS- denies chest pain, palpitations RESP- denies SOB, cough, wheeze ABD- denies N/V, change in stools, abd pain GU- denies dysuria, hematuria, dribbling, incontinence MSK- denies joint pain, muscle aches, injury Neuro- denies headache, dizziness, syncope, seizure activity       Objective:    BP 138/90   Pulse (!) 120   Temp 98.7 F (37.1 C) (Temporal)   Resp 16   Ht 5\' 5"  (1.651 m)   Wt 131 lb (59.4 kg)   LMP 06/14/2019 Comment: regular  SpO2 99%   BMI 21.80 kg/m  GEN- NAD, alert and oriented x3 HEENT-  PERRL, EOMI, non injected sclera, pink conjunctiva, MMM, oropharynx clear Neck- Supple, no thyromegaly CVS- tachycardia, no murmur RESP-CTAB ABD-NABS,soft,NT,ND MSK- FROM upper and lower ext, Psych- anxious appearing, not depressed, good eye contact  Neuro- normal tone , CNII-XII grossly in tact ,normal gait  EXT- No edema Pulses- Radial, DP- 2+   EKG sinus tachycardia     Assessment & Plan:      Problem List Items Addressed This Visit      Unprioritized   Anemia    Recheck her iron levels.  She is going to return for a Pap smear on spring break.      Relevant Orders   Iron, TIBC and Ferritin Panel   GAD (generalized anxiety disorder)    SHe has chronic anxiety which is uncontrolled but she does not want any medication therapy.  We have also discussed psychotherapy states that she is going to arrange this with the previous therapist her sister went to.  She does have propanolol which she states she does have on hand if she needs to take it but her anxiety does rise as well as her heart rate when she comes into the doctor's office.       Other Visit Diagnoses    Tachycardia    -  Primary   Relevant Orders   EKG 12-Lead (Completed)   Routine general medical examination at  a health care facility       Pleat physical done.  Labs to be done.  We will check her CK as well as magnesium potassium for the muscle twitching but nothing neurological seen on exam of concern. Her mother was called and we discussed vaccines.  They wish to defer HPV, influenza and hepatitis A at this time   Relevant Orders   CBC with Differential   Comprehensive metabolic panel   TSH   Muscle twitching       Relevant Orders   Magnesium   CK      Note: This dictation was prepared with Dragon dictation along with smaller phrase technology. Any transcriptional errors that result from this process are unintentional.

## 2019-06-19 NOTE — Assessment & Plan Note (Signed)
He has chronic anxiety which is uncontrolled but she does not want any medication therapy.  We have also discussed psychotherapy states that she is going to arrange this with her previous therapist her sister want to.  She does have propanolol which she states she does have on hand if she needs to take it but her anxiety does rise as well as her heart rate when she comes into the doctor's office.

## 2019-06-20 LAB — CBC WITH DIFFERENTIAL/PLATELET
Absolute Monocytes: 252 cells/uL (ref 200–950)
Basophils Absolute: 9 cells/uL (ref 0–200)
Basophils Relative: 0.2 %
Eosinophils Absolute: 9 cells/uL — ABNORMAL LOW (ref 15–500)
Eosinophils Relative: 0.2 %
HCT: 32.8 % — ABNORMAL LOW (ref 35.0–45.0)
Hemoglobin: 11.2 g/dL — ABNORMAL LOW (ref 11.7–15.5)
Lymphs Abs: 1094 cells/uL (ref 850–3900)
MCH: 31.2 pg (ref 27.0–33.0)
MCHC: 34.1 g/dL (ref 32.0–36.0)
MCV: 91.4 fL (ref 80.0–100.0)
MPV: 10.2 fL (ref 7.5–12.5)
Monocytes Relative: 5.6 %
Neutro Abs: 3137 cells/uL (ref 1500–7800)
Neutrophils Relative %: 69.7 %
Platelets: 282 10*3/uL (ref 140–400)
RBC: 3.59 10*6/uL — ABNORMAL LOW (ref 3.80–5.10)
RDW: 13.3 % (ref 11.0–15.0)
Total Lymphocyte: 24.3 %
WBC: 4.5 10*3/uL (ref 3.8–10.8)

## 2019-06-20 LAB — IRON,TIBC AND FERRITIN PANEL
%SAT: 39 % (calc) (ref 16–45)
Ferritin: 24 ng/mL (ref 16–154)
Iron: 138 ug/dL (ref 40–190)
TIBC: 353 mcg/dL (calc) (ref 250–450)

## 2019-06-20 LAB — COMPREHENSIVE METABOLIC PANEL
AG Ratio: 1.5 (calc) (ref 1.0–2.5)
ALT: 6 U/L (ref 6–29)
AST: 15 U/L (ref 10–30)
Albumin: 4.7 g/dL (ref 3.6–5.1)
Alkaline phosphatase (APISO): 43 U/L (ref 31–125)
BUN: 12 mg/dL (ref 7–25)
CO2: 23 mmol/L (ref 20–32)
Calcium: 9.6 mg/dL (ref 8.6–10.2)
Chloride: 104 mmol/L (ref 98–110)
Creat: 0.86 mg/dL (ref 0.50–1.10)
Globulin: 3.1 g/dL (calc) (ref 1.9–3.7)
Glucose, Bld: 97 mg/dL (ref 65–99)
Potassium: 4 mmol/L (ref 3.5–5.3)
Sodium: 139 mmol/L (ref 135–146)
Total Bilirubin: 0.7 mg/dL (ref 0.2–1.2)
Total Protein: 7.8 g/dL (ref 6.1–8.1)

## 2019-06-20 LAB — CK: Total CK: 91 U/L (ref 29–143)

## 2019-06-20 LAB — TSH: TSH: 0.82 mIU/L

## 2019-06-20 LAB — MAGNESIUM: Magnesium: 1.8 mg/dL (ref 1.5–2.5)

## 2019-06-22 ENCOUNTER — Encounter: Payer: Self-pay | Admitting: *Deleted

## 2019-10-02 ENCOUNTER — Ambulatory Visit: Payer: Self-pay | Admitting: Family Medicine

## 2019-10-08 ENCOUNTER — Ambulatory Visit: Payer: BC Managed Care – PPO | Admitting: Family Medicine

## 2019-10-15 ENCOUNTER — Ambulatory Visit: Payer: BC Managed Care – PPO | Admitting: Family Medicine

## 2019-11-16 DIAGNOSIS — F4010 Social phobia, unspecified: Secondary | ICD-10-CM | POA: Diagnosis not present

## 2019-11-23 DIAGNOSIS — F4010 Social phobia, unspecified: Secondary | ICD-10-CM | POA: Diagnosis not present

## 2019-11-30 DIAGNOSIS — F4010 Social phobia, unspecified: Secondary | ICD-10-CM | POA: Diagnosis not present

## 2019-12-14 DIAGNOSIS — F4010 Social phobia, unspecified: Secondary | ICD-10-CM | POA: Diagnosis not present

## 2019-12-21 DIAGNOSIS — F4010 Social phobia, unspecified: Secondary | ICD-10-CM | POA: Diagnosis not present

## 2019-12-28 DIAGNOSIS — F4010 Social phobia, unspecified: Secondary | ICD-10-CM | POA: Diagnosis not present

## 2020-01-04 DIAGNOSIS — F4010 Social phobia, unspecified: Secondary | ICD-10-CM | POA: Diagnosis not present

## 2020-01-07 ENCOUNTER — Ambulatory Visit: Payer: BC Managed Care – PPO | Admitting: Nurse Practitioner

## 2020-01-11 DIAGNOSIS — F4010 Social phobia, unspecified: Secondary | ICD-10-CM | POA: Diagnosis not present

## 2020-01-25 DIAGNOSIS — F4010 Social phobia, unspecified: Secondary | ICD-10-CM | POA: Diagnosis not present

## 2020-02-01 DIAGNOSIS — F4010 Social phobia, unspecified: Secondary | ICD-10-CM | POA: Diagnosis not present

## 2020-02-05 DIAGNOSIS — Z03818 Encounter for observation for suspected exposure to other biological agents ruled out: Secondary | ICD-10-CM | POA: Diagnosis not present

## 2020-02-05 DIAGNOSIS — Z20822 Contact with and (suspected) exposure to covid-19: Secondary | ICD-10-CM | POA: Diagnosis not present

## 2020-02-22 DIAGNOSIS — F4010 Social phobia, unspecified: Secondary | ICD-10-CM | POA: Diagnosis not present

## 2020-02-26 DIAGNOSIS — F4010 Social phobia, unspecified: Secondary | ICD-10-CM | POA: Diagnosis not present

## 2020-03-04 DIAGNOSIS — F4010 Social phobia, unspecified: Secondary | ICD-10-CM | POA: Diagnosis not present

## 2020-04-21 DIAGNOSIS — F4310 Post-traumatic stress disorder, unspecified: Secondary | ICD-10-CM | POA: Diagnosis not present

## 2020-04-29 DIAGNOSIS — F4310 Post-traumatic stress disorder, unspecified: Secondary | ICD-10-CM | POA: Diagnosis not present

## 2020-05-13 DIAGNOSIS — F4310 Post-traumatic stress disorder, unspecified: Secondary | ICD-10-CM | POA: Diagnosis not present

## 2021-03-12 DIAGNOSIS — F331 Major depressive disorder, recurrent, moderate: Secondary | ICD-10-CM | POA: Diagnosis not present

## 2021-03-19 DIAGNOSIS — F331 Major depressive disorder, recurrent, moderate: Secondary | ICD-10-CM | POA: Diagnosis not present

## 2021-04-02 DIAGNOSIS — F331 Major depressive disorder, recurrent, moderate: Secondary | ICD-10-CM | POA: Diagnosis not present

## 2021-04-16 DIAGNOSIS — F331 Major depressive disorder, recurrent, moderate: Secondary | ICD-10-CM | POA: Diagnosis not present

## 2021-04-30 DIAGNOSIS — F331 Major depressive disorder, recurrent, moderate: Secondary | ICD-10-CM | POA: Diagnosis not present

## 2021-05-14 DIAGNOSIS — F331 Major depressive disorder, recurrent, moderate: Secondary | ICD-10-CM | POA: Diagnosis not present

## 2021-05-21 DIAGNOSIS — F331 Major depressive disorder, recurrent, moderate: Secondary | ICD-10-CM | POA: Diagnosis not present
